# Patient Record
Sex: Female | Born: 1990 | Race: White | Hispanic: No | Marital: Married | Smoking: Current every day smoker
Health system: Southern US, Community
[De-identification: ages and names within clinical notes are randomized; demographics above are authoritative.]

## PROBLEM LIST (undated history)

## (undated) ENCOUNTER — Emergency Department (HOSPITAL_COMMUNITY): Admission: EM | Payer: Self-pay | Source: Home / Self Care

---
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
## 2012-03-18 ENCOUNTER — Encounter: Payer: Self-pay | Admitting: Family Medicine

## 2012-03-18 ENCOUNTER — Ambulatory Visit: Payer: BC Managed Care – PPO

## 2012-03-18 ENCOUNTER — Ambulatory Visit: Payer: BC Managed Care – PPO | Admitting: Family Medicine

## 2012-03-18 VITALS — BP 122/86 | HR 82 | Temp 98.0°F | Resp 16 | Ht 65.0 in | Wt 205.4 lb

## 2012-03-18 DIAGNOSIS — R059 Cough, unspecified: Secondary | ICD-10-CM

## 2012-03-18 DIAGNOSIS — J40 Bronchitis, not specified as acute or chronic: Secondary | ICD-10-CM

## 2012-03-18 DIAGNOSIS — R05 Cough: Secondary | ICD-10-CM

## 2012-03-18 DIAGNOSIS — B079 Viral wart, unspecified: Secondary | ICD-10-CM

## 2012-03-18 LAB — POCT CBC
Granulocyte percent: 70.5 %G (ref 37–80)
HCT, POC: 46.4 % (ref 37.7–47.9)
Hemoglobin: 14.4 g/dL (ref 12.2–16.2)
Lymph, poc: 1.9 (ref 0.6–3.4)
MCH, POC: 29 pg (ref 27–31.2)
MCHC: 31 g/dL — AB (ref 31.8–35.4)
MCV: 93.6 fL (ref 80–97)
MID (cbc): 0.6 (ref 0–0.9)
MPV: 10.1 fL (ref 0–99.8)
POC Granulocyte: 5.8 (ref 2–6.9)
POC LYMPH PERCENT: 22.7 %L (ref 10–50)
POC MID %: 6.8 %M (ref 0–12)
Platelet Count, POC: 304 10*3/uL (ref 142–424)
RBC: 4.96 M/uL (ref 4.04–5.48)
RDW, POC: 12.8 %
WBC: 8.2 10*3/uL (ref 4.6–10.2)

## 2012-03-18 MED ORDER — HYDROCODONE-HOMATROPINE 5-1.5 MG/5ML PO SYRP: 5.0000 mL | ORAL_SOLUTION | Freq: Four times a day (QID) | ORAL | Status: AC | PRN

## 2012-03-18 MED ORDER — DOXYCYCLINE HYCLATE 100 MG PO TABS: 100.0000 mg | ORAL_TABLET | Freq: Two times a day (BID) | ORAL | Status: AC

## 2012-03-18 MED ORDER — BENZONATATE 100 MG PO CAPS: 200.0000 mg | ORAL_CAPSULE | Freq: Two times a day (BID) | ORAL | Status: AC | PRN

## 2012-03-18 NOTE — Progress Notes (Signed)
[facility]:  Office Visit  Chief Complaint:  Chief Complaint  Patient presents with  . Pneumonia    Patient states she was diagnosed with early stages of Pneumonia, she was put on a Zpack and she is not feeling better. wats a recheck    HPI: Lauren Newton is a 21 y.o. female who complains of  Cough, chest congestion, dark sputum production, was on Z-pack for "early stages of PNA" but not feeling any better, Denies asthma, allergies. Has had some wheezing this AM. + smoker, minimal sinus congestion, denies msk pain, ear pain.   History reviewed. No pertinent past medical history. History reviewed. No pertinent past surgical history. History   Social History  . Marital Status: Married    Spouse Name: N/A    Number of Children: N/A  . Years of Education: N/A   Social History Main Topics  . Smoking status: Current Every Day Smoker  . Smokeless tobacco: None  . Alcohol Use:   . Drug Use: 7 per week    Special: Marijuana  . Sexually Active: Yes    Birth Control/ Protection: None   Other Topics Concern  . None   Social History Narrative  . None   No family history on file. No Known Allergies Prior to Admission medications   Not on File     ROS: The patient denies fevers, chills, night sweats, unintentional weight loss, chest pain, palpitations, wheezing, dyspnea on exertion, nausea, vomiting, abdominal pain, dysuria, hematuria, melena, numbness, weakness, or tingling.   All other systems have been reviewed and were otherwise negative with the exception of those mentioned in the HPI and as above.    PHYSICAL EXAM: Filed Vitals:   03/18/12 1246  BP: 122/86  Pulse: 82  Temp: 98 F (36.7 C)  Resp: 16   Filed Vitals:   03/18/12 1246  Height: 5\' 5"  (1.651 m)  Weight: 205 lb 6.4 oz (93.169 kg)   Body mass index is 34.18 kg/(m^2).  General: Alert, no acute distress HEENT:  Normocephalic, atraumatic, oropharynx patent.  Cardiovascular:   Regular rate and rhythm, no rubs murmurs or gallops.  No Carotid bruits, radial pulse intact. No pedal edema.  Respiratory: Clear to auscultation bilaterally.  No wheezes, rales, or rhonchi.  No cyanosis, no use of accessory musculature GI: No organomegaly, abdomen is soft and non-tender, positive bowel sounds.  No masses. Skin: wart on right index finger Neurologic: Facial musculature symmetric. Psychiatric: Patient is appropriate throughout our interaction. Lymphatic: No cervical lymphadenopathy Musculoskeletal: Gait intact.   LABS: Results for orders placed in visit on 03/18/12  POCT CBC      Component Value Range   WBC 8.2  4.6 - 10.2 K/uL   Lymph, poc 1.9  0.6 - 3.4   POC LYMPH PERCENT 22.7  10 - 50 %L   MID (cbc) 0.6  0 - 0.9   POC MID % 6.8  0 - 12 %M   POC Granulocyte 5.8  2 - 6.9   Granulocyte percent 70.5  37 - 80 %G   RBC 4.96  4.04 - 5.48 M/uL   Hemoglobin 14.4  12.2 - 16.2 g/dL   HCT, POC 16.1  09.6 - 47.9 %   MCV 93.6  80 - 97 fL   MCH, POC 29.0  27 - 31.2 pg   MCHC 31.0 (*) 31.8 - 35.4 g/dL   RDW, POC 04.5     Platelet Count, POC 304  142 - 424  K/uL   MPV 10.1  0 - 99.8 fL     EKG/XRAY:   Primary read interpreted by Dr. Conley Rolls at [facility]. + increase vascular markings + bronchitic changes, no pneumothorax, no e/o ifiltrate   ASSESSMENT/PLAN: Encounter Diagnoses  Name Primary?  . Cough Yes  . Bronchitis   . Wart viral    Most likely bronchitis and not PNA Patient has been on Azithromycin for 1 week and not feeling better Continues to smoke but less than before I told her to try sx treatment and if it does not help then she may take the Doxycycline however I  Do not think this is what is needed, patient is insistent on getting rx for abx School note for today Advise to stop smoking.  Wart on right hand, index finger txt with cryo x1. Pt verbally consented to procedure, tolerated it well, no complications   Lauren Dokken PHUONG, DO 03/19/2012 9:38 AM

## 2012-03-19 NOTE — Patient Instructions (Signed)
Antibiotic Resistance  Antibiotics are drugs. They fight infections caused by bacteria. Antibiotics greatly reduce illness and death from infectious diseases. Over time, the bacteria that antibiotics once controlled are much harder to kill.  CAUSES   Antibiotic resistance occurs when bacteria change in some way. These changes can lessen the abilities of drugs designed to cure infections. The over-use of antibiotics can cause antibiotic resistance. Almost all important bacterial infections in the world are becoming resistant to drugs. Antibiotic resistance has been called one of the world's most pressing public health problems.   Antibiotics should be used to treat bacterial infections. But they are not effective against viral infections. These include the common cold, most sore throats, and the flu. Smart use of antibiotics will control the spread of resistance.   TREATMENT    Only use antibiotics as prescribed by your caregiver.   Talk with your caregiver about antibiotic resistance.   Ask what else you can do to feel better.   Do not take an antibiotic for a viral infection. This could be a cold, cough or the flu.   Do not save some of your antibiotic for the next time you get sick.   Take an antibiotic exactly as the caregiver tells you.   Do not take an antibiotic that is prescribed for someone else.   Use the antibiotic as directed. Take the correct dose at the scheduled time.  SEEK MEDICAL CARE IF:   You react to the antibiotic with:   A rash.   Itching.   An upset stomach.  Document Released: 08/04/2002 Document Revised: 08/06/2011 Document Reviewed: 03/08/2008  [employer] Patient Information 2013 [employer].

## 2014-05-13 ENCOUNTER — Encounter (HOSPITAL_COMMUNITY): Payer: Self-pay | Admitting: Cardiology

## 2014-05-13 NOTE — ED Notes (Signed)
Pt reports that she is a type 2 diabetic and her CBG has been in the 400s today. Reports increased thirst, and headache at home. Reports that she takes Metformin

## 2014-11-15 ENCOUNTER — Emergency Department (HOSPITAL_COMMUNITY)
Admission: EM | Admit: 2014-11-15 | Discharge: 2014-11-15 | Disposition: A | Discharge status: Home / Self Care | Payer: BLUE CROSS/BLUE SHIELD | Attending: Emergency Medicine | Admitting: Emergency Medicine

## 2014-11-15 ENCOUNTER — Encounter (HOSPITAL_COMMUNITY): Payer: Self-pay

## 2014-11-15 DIAGNOSIS — Z720 Tobacco use: Secondary | ICD-10-CM | POA: Diagnosis not present

## 2014-11-15 DIAGNOSIS — H109 Unspecified conjunctivitis: Secondary | ICD-10-CM | POA: Diagnosis not present

## 2014-11-15 DIAGNOSIS — H578 Other specified disorders of eye and adnexa: Secondary | ICD-10-CM | POA: Diagnosis present

## 2014-11-15 MED ORDER — ERYTHROMYCIN 5 MG/GM OP OINT
TOPICAL_OINTMENT | Freq: Four times a day (QID) | OPHTHALMIC | Status: DC
  Administered 2014-11-15: via OPHTHALMIC
  Filled 2014-11-15: qty 3.5

## 2014-11-15 NOTE — ED Notes (Signed)
Pt presents with c/o right eye problem that started today. Pt reports pain to that right eye as well as some draining and redness.

## 2014-11-15 NOTE — ED Provider Notes (Signed)
CSN: [id number]     Arrival date & time 11/15/14  2103 History  This chart was scribed for non-physician practitioner Elpidio Anis, PA-C working with Gilda Crease, MD by Lyndel Safe, ED Scribe. This patient was seen in room WTR5/WTR5 and the patient's care was started at 11:11 PM.   Chief Complaint  Patient presents with  . Eye Problem   The history is provided by the patient. No language interpreter was used.   HPI Comments: Lauren Newton is a 24 y.o. female who presents to the Emergency Department complaining of sudden onset, constant, mild right eye sensitivity with associated mild drainage and erythema that began earlier today. She states her peripheral vision in her right eye is blurry. She wears correctional lenses and is followed by [facility] opthalmology. Pt denies foreign body in right eye, left eye vision changes, photophobia, or vision loss. NKDA  History reviewed. No pertinent past medical history. History reviewed. No pertinent past surgical history. No family history on file. History  Substance Use Topics  . Smoking status: Current Every Day Smoker  . Smokeless tobacco: Not on file  . Alcohol Use: No   OB History    No data available     Review of Systems  Constitutional: Negative for fever.  Eyes: Positive for discharge and redness. Negative for photophobia and visual disturbance.   Allergies  Review of patient's allergies indicates no known allergies.  Home Medications   Prior to Admission medications   Medication Sig Start Date End Date Taking? Authorizing Provider  doxycycline (VIBRA-TABS) 100 MG tablet Take 1 tablet (100 mg total) by mouth 2 (two) times daily. Patient not taking: Reported on 11/15/2014 03/18/12   Thao P Le, DO  HYDROcodone-homatropine (HYDROMET) 5-1.5 MG/5ML syrup Take 5 mLs by mouth every 6 (six) hours as needed for cough. Patient not taking: Reported on 11/15/2014 03/18/12   Thao P Le, DO   BP 134/70 mmHg  Pulse 90   Temp(Src) 98.2 F (36.8 C) (Oral)  Resp 18  SpO2 100%  LMP 10/04/2014 (Approximate) Physical Exam  Constitutional: She is oriented to person, place, and time. She appears well-developed and well-nourished. No distress.  HENT:  Head: Normocephalic.  Mouth/Throat: No oropharyngeal exudate.  No facial tenderness.   Eyes: Pupils are equal, round, and reactive to light. Right eye exhibits no discharge. Left eye exhibits no discharge. No scleral icterus.  Right conjunctival redness without purulence or chemosis. Cornea clear. Eyes pearled. No surrounding lid swelling or redness.   Cardiovascular: Normal rate.   Pulmonary/Chest: Effort normal.  Neurological: She is alert and oriented to person, place, and time.  Skin: Skin is warm and dry.  Psychiatric: She has a normal mood and affect.  Nursing note and vitals reviewed.  ED Course  Procedures  DIAGNOSTIC STUDIES: Oxygen Saturation is 100% on RA, normal by my interpretation.    COORDINATION OF CARE: 11:13 PM Discussed treatment plan which includes to prescribe antibiotic ointment. Pt acknowledges and agrees to plan.   Labs Review Labs Reviewed - No data to display  Imaging Review No results found.   EKG Interpretation None      MDM   Final diagnoses:  None    1. Conjunctivitis  Erythromycin ointment provided. She will follow up with [facility] if symptoms persist.  I personally performed the services described in this documentation, which was scribed in my presence. The recorded information has been reviewed and is accurate.     Elpidio Anis, PA-C 11/16/14 [phone]  Gilda Crease, MD 11/22/14 [phone]

## 2014-11-15 NOTE — Discharge Instructions (Signed)

## 2017-02-26 DIAGNOSIS — M9903 Segmental and somatic dysfunction of lumbar region: Secondary | ICD-10-CM | POA: Diagnosis not present

## 2017-02-26 DIAGNOSIS — M9902 Segmental and somatic dysfunction of thoracic region: Secondary | ICD-10-CM | POA: Diagnosis not present

## 2017-02-26 DIAGNOSIS — M9901 Segmental and somatic dysfunction of cervical region: Secondary | ICD-10-CM | POA: Diagnosis not present

## 2017-04-02 DIAGNOSIS — M9902 Segmental and somatic dysfunction of thoracic region: Secondary | ICD-10-CM | POA: Diagnosis not present

## 2017-04-02 DIAGNOSIS — M9901 Segmental and somatic dysfunction of cervical region: Secondary | ICD-10-CM | POA: Diagnosis not present

## 2017-04-02 DIAGNOSIS — M9903 Segmental and somatic dysfunction of lumbar region: Secondary | ICD-10-CM | POA: Diagnosis not present

## 2017-05-13 DIAGNOSIS — M9902 Segmental and somatic dysfunction of thoracic region: Secondary | ICD-10-CM | POA: Diagnosis not present

## 2017-05-13 DIAGNOSIS — M9901 Segmental and somatic dysfunction of cervical region: Secondary | ICD-10-CM | POA: Diagnosis not present

## 2017-05-13 DIAGNOSIS — M9903 Segmental and somatic dysfunction of lumbar region: Secondary | ICD-10-CM | POA: Diagnosis not present

## 2017-06-18 DIAGNOSIS — M9901 Segmental and somatic dysfunction of cervical region: Secondary | ICD-10-CM | POA: Diagnosis not present

## 2017-06-18 DIAGNOSIS — M9903 Segmental and somatic dysfunction of lumbar region: Secondary | ICD-10-CM | POA: Diagnosis not present

## 2017-06-18 DIAGNOSIS — M9902 Segmental and somatic dysfunction of thoracic region: Secondary | ICD-10-CM | POA: Diagnosis not present

## 2017-07-08 DIAGNOSIS — F411 Generalized anxiety disorder: Secondary | ICD-10-CM | POA: Diagnosis not present

## 2017-07-16 DIAGNOSIS — M9901 Segmental and somatic dysfunction of cervical region: Secondary | ICD-10-CM | POA: Diagnosis not present

## 2017-07-16 DIAGNOSIS — M9902 Segmental and somatic dysfunction of thoracic region: Secondary | ICD-10-CM | POA: Diagnosis not present

## 2017-07-16 DIAGNOSIS — M9903 Segmental and somatic dysfunction of lumbar region: Secondary | ICD-10-CM | POA: Diagnosis not present

## 2017-07-25 DIAGNOSIS — M9903 Segmental and somatic dysfunction of lumbar region: Secondary | ICD-10-CM | POA: Diagnosis not present

## 2017-07-25 DIAGNOSIS — M9902 Segmental and somatic dysfunction of thoracic region: Secondary | ICD-10-CM | POA: Diagnosis not present

## 2017-07-25 DIAGNOSIS — M9901 Segmental and somatic dysfunction of cervical region: Secondary | ICD-10-CM | POA: Diagnosis not present

## 2017-07-25 DIAGNOSIS — F411 Generalized anxiety disorder: Secondary | ICD-10-CM | POA: Diagnosis not present

## 2017-08-16 DIAGNOSIS — F411 Generalized anxiety disorder: Secondary | ICD-10-CM | POA: Diagnosis not present

## 2017-08-22 DIAGNOSIS — M9902 Segmental and somatic dysfunction of thoracic region: Secondary | ICD-10-CM | POA: Diagnosis not present

## 2017-08-22 DIAGNOSIS — M9903 Segmental and somatic dysfunction of lumbar region: Secondary | ICD-10-CM | POA: Diagnosis not present

## 2017-08-22 DIAGNOSIS — M9901 Segmental and somatic dysfunction of cervical region: Secondary | ICD-10-CM | POA: Diagnosis not present

## 2017-09-24 DIAGNOSIS — M9903 Segmental and somatic dysfunction of lumbar region: Secondary | ICD-10-CM | POA: Diagnosis not present

## 2017-09-24 DIAGNOSIS — M9902 Segmental and somatic dysfunction of thoracic region: Secondary | ICD-10-CM | POA: Diagnosis not present

## 2017-09-24 DIAGNOSIS — F411 Generalized anxiety disorder: Secondary | ICD-10-CM | POA: Diagnosis not present

## 2017-09-24 DIAGNOSIS — M9901 Segmental and somatic dysfunction of cervical region: Secondary | ICD-10-CM | POA: Diagnosis not present

## 2017-10-22 DIAGNOSIS — M9902 Segmental and somatic dysfunction of thoracic region: Secondary | ICD-10-CM | POA: Diagnosis not present

## 2017-10-22 DIAGNOSIS — M9903 Segmental and somatic dysfunction of lumbar region: Secondary | ICD-10-CM | POA: Diagnosis not present

## 2017-10-22 DIAGNOSIS — M9901 Segmental and somatic dysfunction of cervical region: Secondary | ICD-10-CM | POA: Diagnosis not present

## 2017-11-19 DIAGNOSIS — M9902 Segmental and somatic dysfunction of thoracic region: Secondary | ICD-10-CM | POA: Diagnosis not present

## 2017-11-19 DIAGNOSIS — M9903 Segmental and somatic dysfunction of lumbar region: Secondary | ICD-10-CM | POA: Diagnosis not present

## 2017-11-19 DIAGNOSIS — M9901 Segmental and somatic dysfunction of cervical region: Secondary | ICD-10-CM | POA: Diagnosis not present

## 2017-12-18 DIAGNOSIS — M9901 Segmental and somatic dysfunction of cervical region: Secondary | ICD-10-CM | POA: Diagnosis not present

## 2017-12-18 DIAGNOSIS — M9902 Segmental and somatic dysfunction of thoracic region: Secondary | ICD-10-CM | POA: Diagnosis not present

## 2017-12-18 DIAGNOSIS — M9903 Segmental and somatic dysfunction of lumbar region: Secondary | ICD-10-CM | POA: Diagnosis not present

## 2018-01-22 DIAGNOSIS — M9901 Segmental and somatic dysfunction of cervical region: Secondary | ICD-10-CM | POA: Diagnosis not present

## 2018-01-22 DIAGNOSIS — M9903 Segmental and somatic dysfunction of lumbar region: Secondary | ICD-10-CM | POA: Diagnosis not present

## 2018-01-22 DIAGNOSIS — M9902 Segmental and somatic dysfunction of thoracic region: Secondary | ICD-10-CM | POA: Diagnosis not present

## 2018-02-04 DIAGNOSIS — F411 Generalized anxiety disorder: Secondary | ICD-10-CM | POA: Diagnosis not present

## 2018-02-07 DIAGNOSIS — M9901 Segmental and somatic dysfunction of cervical region: Secondary | ICD-10-CM | POA: Diagnosis not present

## 2018-02-07 DIAGNOSIS — M9903 Segmental and somatic dysfunction of lumbar region: Secondary | ICD-10-CM | POA: Diagnosis not present

## 2018-02-07 DIAGNOSIS — M9902 Segmental and somatic dysfunction of thoracic region: Secondary | ICD-10-CM | POA: Diagnosis not present

## 2018-02-26 DIAGNOSIS — F411 Generalized anxiety disorder: Secondary | ICD-10-CM | POA: Diagnosis not present

## 2018-02-28 DIAGNOSIS — M9903 Segmental and somatic dysfunction of lumbar region: Secondary | ICD-10-CM | POA: Diagnosis not present

## 2018-02-28 DIAGNOSIS — M9901 Segmental and somatic dysfunction of cervical region: Secondary | ICD-10-CM | POA: Diagnosis not present

## 2018-02-28 DIAGNOSIS — M9902 Segmental and somatic dysfunction of thoracic region: Secondary | ICD-10-CM | POA: Diagnosis not present

## 2018-03-18 DIAGNOSIS — F411 Generalized anxiety disorder: Secondary | ICD-10-CM | POA: Diagnosis not present

## 2018-03-20 DIAGNOSIS — M9902 Segmental and somatic dysfunction of thoracic region: Secondary | ICD-10-CM | POA: Diagnosis not present

## 2018-03-20 DIAGNOSIS — M9903 Segmental and somatic dysfunction of lumbar region: Secondary | ICD-10-CM | POA: Diagnosis not present

## 2018-03-20 DIAGNOSIS — M791 Myalgia, unspecified site: Secondary | ICD-10-CM | POA: Diagnosis not present

## 2018-03-20 DIAGNOSIS — M9901 Segmental and somatic dysfunction of cervical region: Secondary | ICD-10-CM | POA: Diagnosis not present

## 2018-04-01 DIAGNOSIS — F411 Generalized anxiety disorder: Secondary | ICD-10-CM | POA: Diagnosis not present

## 2018-04-02 DIAGNOSIS — M9901 Segmental and somatic dysfunction of cervical region: Secondary | ICD-10-CM | POA: Diagnosis not present

## 2018-04-02 DIAGNOSIS — M9903 Segmental and somatic dysfunction of lumbar region: Secondary | ICD-10-CM | POA: Diagnosis not present

## 2018-04-02 DIAGNOSIS — M9902 Segmental and somatic dysfunction of thoracic region: Secondary | ICD-10-CM | POA: Diagnosis not present

## 2018-04-02 DIAGNOSIS — M791 Myalgia, unspecified site: Secondary | ICD-10-CM | POA: Diagnosis not present

## 2018-04-08 DIAGNOSIS — F411 Generalized anxiety disorder: Secondary | ICD-10-CM | POA: Diagnosis not present

## 2018-04-17 DIAGNOSIS — M9902 Segmental and somatic dysfunction of thoracic region: Secondary | ICD-10-CM | POA: Diagnosis not present

## 2018-04-17 DIAGNOSIS — M791 Myalgia, unspecified site: Secondary | ICD-10-CM | POA: Diagnosis not present

## 2018-04-17 DIAGNOSIS — M9903 Segmental and somatic dysfunction of lumbar region: Secondary | ICD-10-CM | POA: Diagnosis not present

## 2018-04-17 DIAGNOSIS — M9901 Segmental and somatic dysfunction of cervical region: Secondary | ICD-10-CM | POA: Diagnosis not present

## 2018-05-06 DIAGNOSIS — F411 Generalized anxiety disorder: Secondary | ICD-10-CM | POA: Diagnosis not present

## 2018-05-14 DIAGNOSIS — R509 Fever, unspecified: Secondary | ICD-10-CM | POA: Diagnosis not present

## 2018-05-27 DIAGNOSIS — J0100 Acute maxillary sinusitis, unspecified: Secondary | ICD-10-CM | POA: Diagnosis not present

## 2018-05-29 DIAGNOSIS — F411 Generalized anxiety disorder: Secondary | ICD-10-CM | POA: Diagnosis not present

## 2018-05-30 DIAGNOSIS — M9901 Segmental and somatic dysfunction of cervical region: Secondary | ICD-10-CM | POA: Diagnosis not present

## 2018-05-30 DIAGNOSIS — M9902 Segmental and somatic dysfunction of thoracic region: Secondary | ICD-10-CM | POA: Diagnosis not present

## 2018-05-30 DIAGNOSIS — M791 Myalgia, unspecified site: Secondary | ICD-10-CM | POA: Diagnosis not present

## 2018-05-30 DIAGNOSIS — M9903 Segmental and somatic dysfunction of lumbar region: Secondary | ICD-10-CM | POA: Diagnosis not present

## 2018-06-03 DIAGNOSIS — B078 Other viral warts: Secondary | ICD-10-CM | POA: Diagnosis not present

## 2018-06-12 DIAGNOSIS — F17200 Nicotine dependence, unspecified, uncomplicated: Secondary | ICD-10-CM | POA: Diagnosis not present

## 2018-06-12 DIAGNOSIS — G43009 Migraine without aura, not intractable, without status migrainosus: Secondary | ICD-10-CM | POA: Diagnosis not present

## 2018-06-12 DIAGNOSIS — F411 Generalized anxiety disorder: Secondary | ICD-10-CM | POA: Diagnosis not present

## 2018-06-20 DIAGNOSIS — F411 Generalized anxiety disorder: Secondary | ICD-10-CM | POA: Diagnosis not present

## 2018-07-04 DIAGNOSIS — F411 Generalized anxiety disorder: Secondary | ICD-10-CM | POA: Diagnosis not present

## 2018-07-10 DIAGNOSIS — F411 Generalized anxiety disorder: Secondary | ICD-10-CM | POA: Diagnosis not present

## 2018-07-11 DIAGNOSIS — M791 Myalgia, unspecified site: Secondary | ICD-10-CM | POA: Diagnosis not present

## 2018-07-11 DIAGNOSIS — M9901 Segmental and somatic dysfunction of cervical region: Secondary | ICD-10-CM | POA: Diagnosis not present

## 2018-07-11 DIAGNOSIS — M9902 Segmental and somatic dysfunction of thoracic region: Secondary | ICD-10-CM | POA: Diagnosis not present

## 2018-07-11 DIAGNOSIS — M9903 Segmental and somatic dysfunction of lumbar region: Secondary | ICD-10-CM | POA: Diagnosis not present

## 2018-07-25 DIAGNOSIS — F411 Generalized anxiety disorder: Secondary | ICD-10-CM | POA: Diagnosis not present

## 2018-08-04 DIAGNOSIS — M791 Myalgia, unspecified site: Secondary | ICD-10-CM | POA: Diagnosis not present

## 2018-08-04 DIAGNOSIS — M9903 Segmental and somatic dysfunction of lumbar region: Secondary | ICD-10-CM | POA: Diagnosis not present

## 2018-08-04 DIAGNOSIS — M9901 Segmental and somatic dysfunction of cervical region: Secondary | ICD-10-CM | POA: Diagnosis not present

## 2018-08-04 DIAGNOSIS — M9902 Segmental and somatic dysfunction of thoracic region: Secondary | ICD-10-CM | POA: Diagnosis not present

## 2018-09-02 DIAGNOSIS — F411 Generalized anxiety disorder: Secondary | ICD-10-CM | POA: Diagnosis not present

## 2018-09-29 DIAGNOSIS — M9902 Segmental and somatic dysfunction of thoracic region: Secondary | ICD-10-CM | POA: Diagnosis not present

## 2018-09-29 DIAGNOSIS — M9903 Segmental and somatic dysfunction of lumbar region: Secondary | ICD-10-CM | POA: Diagnosis not present

## 2018-09-29 DIAGNOSIS — M791 Myalgia, unspecified site: Secondary | ICD-10-CM | POA: Diagnosis not present

## 2018-09-29 DIAGNOSIS — M9901 Segmental and somatic dysfunction of cervical region: Secondary | ICD-10-CM | POA: Diagnosis not present

## 2018-10-29 DIAGNOSIS — F411 Generalized anxiety disorder: Secondary | ICD-10-CM | POA: Diagnosis not present

## 2018-10-31 DIAGNOSIS — M9903 Segmental and somatic dysfunction of lumbar region: Secondary | ICD-10-CM | POA: Diagnosis not present

## 2018-10-31 DIAGNOSIS — M9902 Segmental and somatic dysfunction of thoracic region: Secondary | ICD-10-CM | POA: Diagnosis not present

## 2018-10-31 DIAGNOSIS — M9901 Segmental and somatic dysfunction of cervical region: Secondary | ICD-10-CM | POA: Diagnosis not present

## 2018-10-31 DIAGNOSIS — M791 Myalgia, unspecified site: Secondary | ICD-10-CM | POA: Diagnosis not present

## 2018-11-18 DIAGNOSIS — F411 Generalized anxiety disorder: Secondary | ICD-10-CM | POA: Diagnosis not present

## 2018-11-18 DIAGNOSIS — R05 Cough: Secondary | ICD-10-CM | POA: Diagnosis not present

## 2018-11-19 DIAGNOSIS — R05 Cough: Secondary | ICD-10-CM | POA: Diagnosis not present

## 2018-11-26 DIAGNOSIS — F411 Generalized anxiety disorder: Secondary | ICD-10-CM | POA: Diagnosis not present

## 2018-12-02 DIAGNOSIS — M791 Myalgia, unspecified site: Secondary | ICD-10-CM | POA: Diagnosis not present

## 2018-12-02 DIAGNOSIS — M9901 Segmental and somatic dysfunction of cervical region: Secondary | ICD-10-CM | POA: Diagnosis not present

## 2018-12-02 DIAGNOSIS — M9903 Segmental and somatic dysfunction of lumbar region: Secondary | ICD-10-CM | POA: Diagnosis not present

## 2018-12-02 DIAGNOSIS — M9902 Segmental and somatic dysfunction of thoracic region: Secondary | ICD-10-CM | POA: Diagnosis not present

## 2018-12-23 DIAGNOSIS — M9903 Segmental and somatic dysfunction of lumbar region: Secondary | ICD-10-CM | POA: Diagnosis not present

## 2018-12-23 DIAGNOSIS — M791 Myalgia, unspecified site: Secondary | ICD-10-CM | POA: Diagnosis not present

## 2018-12-23 DIAGNOSIS — M9902 Segmental and somatic dysfunction of thoracic region: Secondary | ICD-10-CM | POA: Diagnosis not present

## 2018-12-23 DIAGNOSIS — M9901 Segmental and somatic dysfunction of cervical region: Secondary | ICD-10-CM | POA: Diagnosis not present

## 2018-12-24 DIAGNOSIS — F411 Generalized anxiety disorder: Secondary | ICD-10-CM | POA: Diagnosis not present

## 2019-01-07 DIAGNOSIS — F411 Generalized anxiety disorder: Secondary | ICD-10-CM | POA: Diagnosis not present

## 2019-01-12 DIAGNOSIS — M791 Myalgia, unspecified site: Secondary | ICD-10-CM | POA: Diagnosis not present

## 2019-01-12 DIAGNOSIS — M9902 Segmental and somatic dysfunction of thoracic region: Secondary | ICD-10-CM | POA: Diagnosis not present

## 2019-01-12 DIAGNOSIS — M9901 Segmental and somatic dysfunction of cervical region: Secondary | ICD-10-CM | POA: Diagnosis not present

## 2019-01-12 DIAGNOSIS — M9903 Segmental and somatic dysfunction of lumbar region: Secondary | ICD-10-CM | POA: Diagnosis not present

## 2019-01-26 DIAGNOSIS — F411 Generalized anxiety disorder: Secondary | ICD-10-CM | POA: Diagnosis not present

## 2019-06-04 ENCOUNTER — Other Ambulatory Visit: Payer: Self-pay | Admitting: Family Medicine

## 2019-06-04 DIAGNOSIS — G8929 Other chronic pain: Secondary | ICD-10-CM

## 2019-06-05 ENCOUNTER — Ambulatory Visit (INDEPENDENT_AMBULATORY_CARE_PROVIDER_SITE_OTHER): Payer: 59

## 2019-06-05 ENCOUNTER — Other Ambulatory Visit: Payer: Self-pay | Admitting: Family Medicine

## 2019-06-05 ENCOUNTER — Other Ambulatory Visit: Payer: Self-pay

## 2019-06-05 DIAGNOSIS — R519 Headache, unspecified: Secondary | ICD-10-CM

## 2019-06-08 ENCOUNTER — Other Ambulatory Visit: Payer: BLUE CROSS/BLUE SHIELD

## 2021-10-02 IMAGING — CT CT HEAD W/O CM
3 of 4 series · 15 of 47 positions shown, 18 images · non-contrast
Comparison: No pertinent prior studies available for comparison.

CLINICAL DATA: Severe headache. History of migraine, for evaluation
of episodic intense headache for 2 weeks. Additional history
provided by technologist: Sudden right-sided head pain on [REDACTED]
that was not typical for migraine, nausea x2 weeks.

EXAM:
CT HEAD WITHOUT CONTRAST
TECHNIQUE: Contiguous axial images were obtained from the base of the skull
through the vertex without intravenous contrast.

[Series 2: head wo · axial · 0.44mm/px · z∈[-200,-70]mm · 9 of 32 slices shown, 12 images]
[im 3/32  brain]
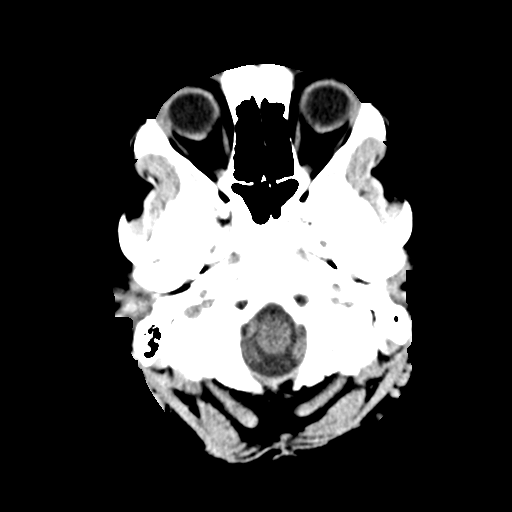
[im 3/32  bone]
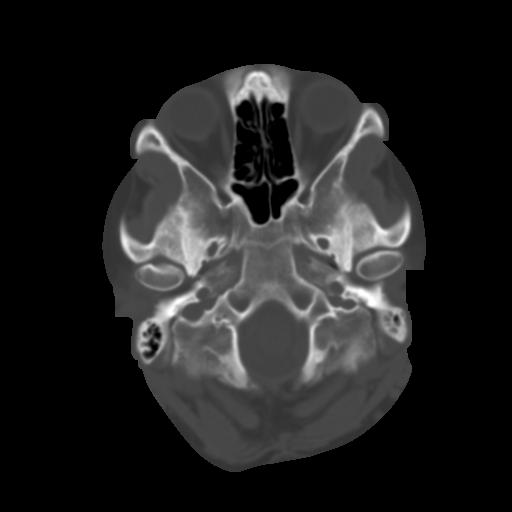
[im 7/32  brain]
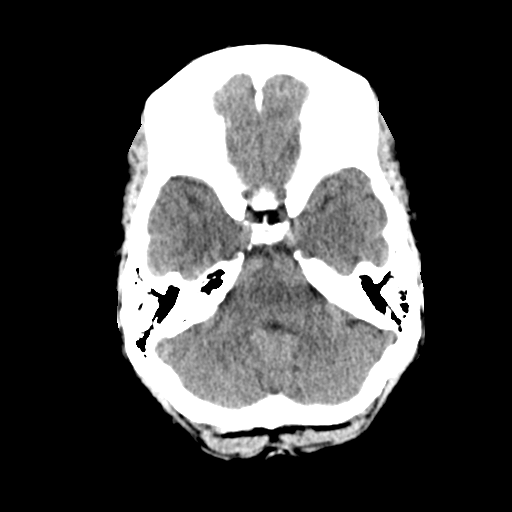
[im 9/32  brain]
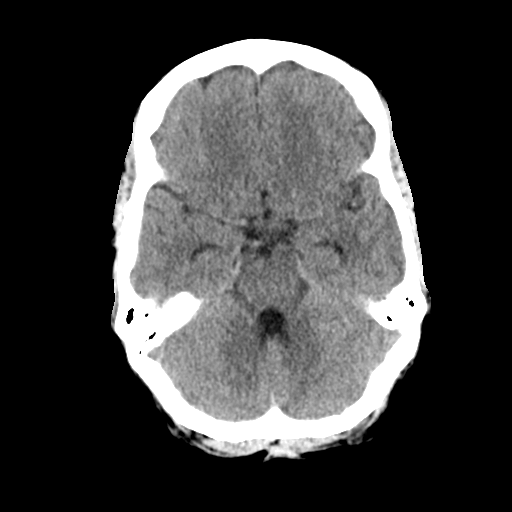
[im 14/32  brain]
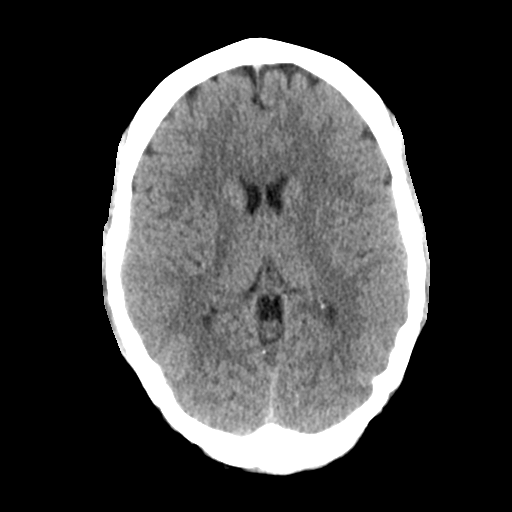
[im 16/32  brain]
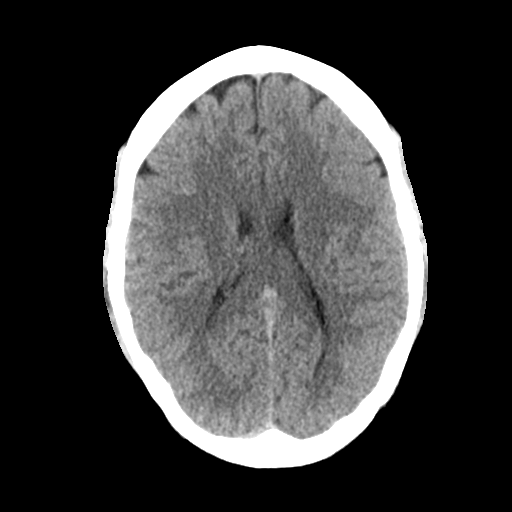
[im 16/32  bone]
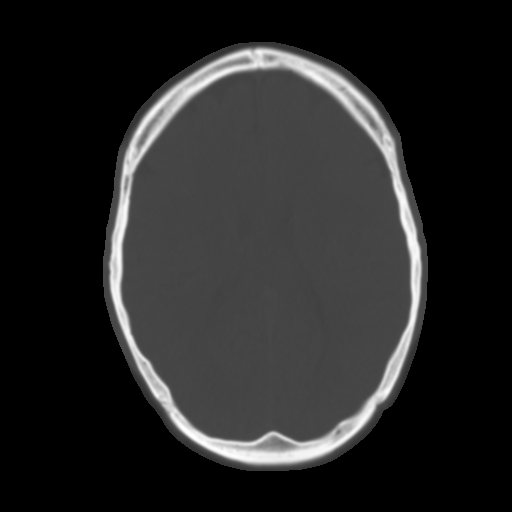
[im 18/32  brain]
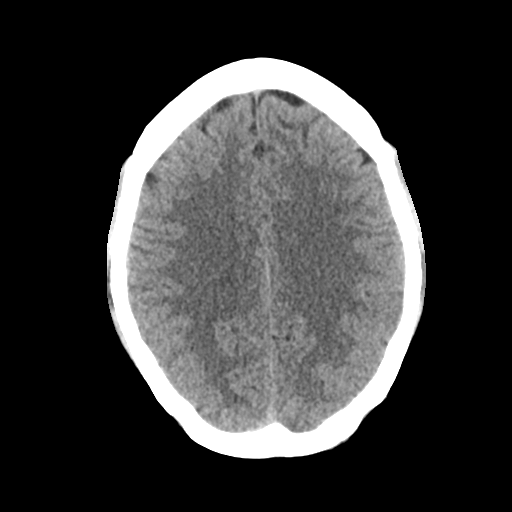
[im 23/32  brain]
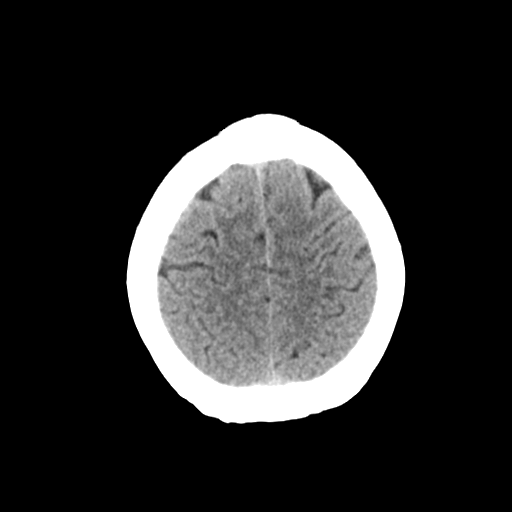
[im 25/32  brain]
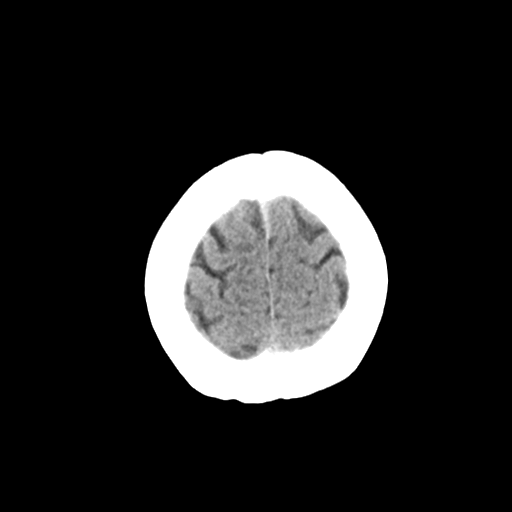
[im 29/32  brain]
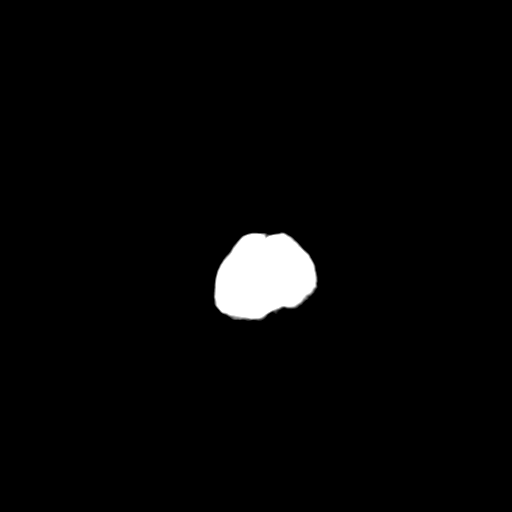
[im 29/32  bone]
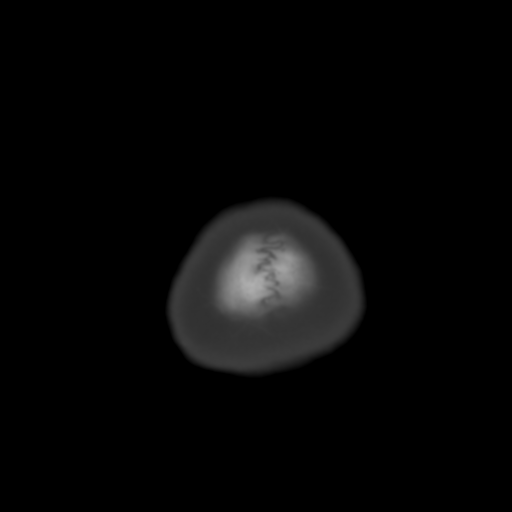

[Series 4: head wo coronal · coronal · 0.32mm/px · 3 of 71 slices shown]
[im 24/71  brain]
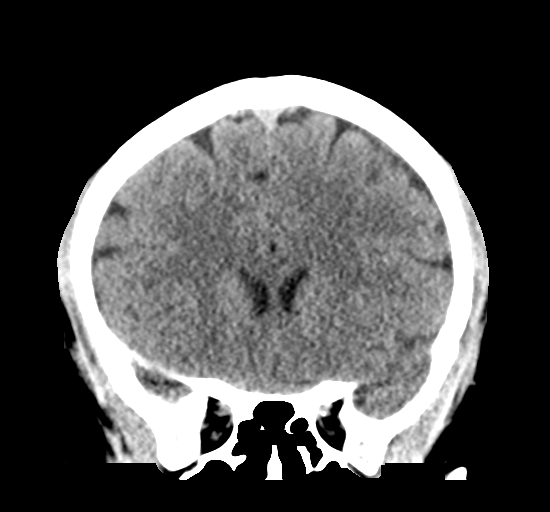
[im 32/71  brain]
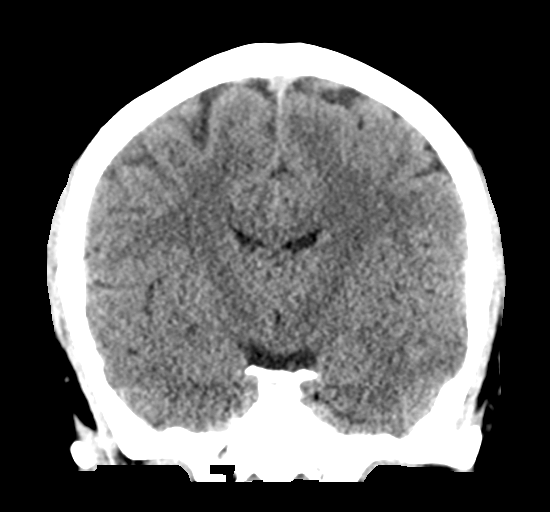
[im 39/71  brain]
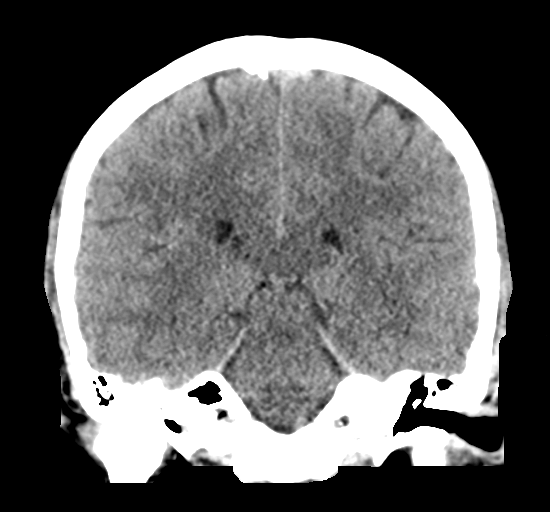

[Series 5: head wo sagittal · sagittal · 0.32mm/px · 3 of 56 slices shown]
[im 19/56  brain]
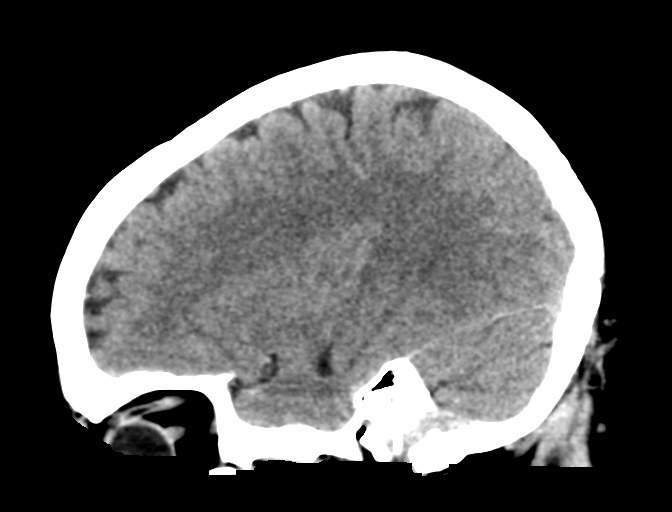
[im 28/56  brain]
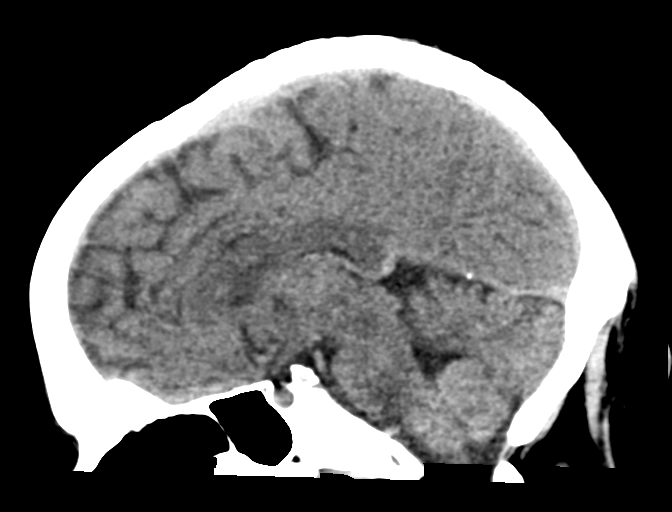
[im 37/56  brain]
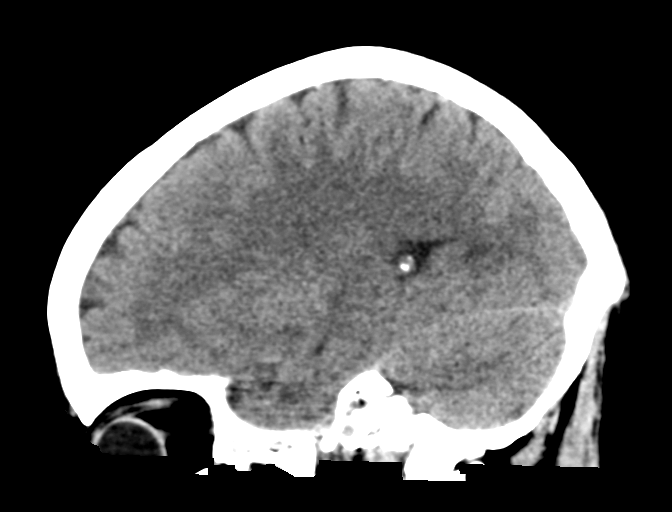

[15 of 47 positions shown; findings below may reference images not displayed]

FINDINGS: Brain:

No evidence of acute intracranial hemorrhage.

No demarcated cortical infarction.

No evidence of intracranial mass.

No midline shift or extra-axial fluid collection.

Cerebral volume is normal for age.

Vascular: No hyperdense vessel.

Skull: No calvarial fracture. Subcentimeter nonspecific focus of
calcification or ossification along the right parietal calvarium
(series 5, image 17).

Sinuses/Orbits: Visualized orbits demonstrate no acute abnormality.
No significant paranasal sinus disease or mastoid effusion at the
imaged levels.
IMPRESSION: Normal CT appearance of the brain. No evidence of acute intracranial
abnormality.

## 2023-07-16 DIAGNOSIS — M7701 Medial epicondylitis, right elbow: Secondary | ICD-10-CM | POA: Diagnosis not present

## 2023-09-03 DIAGNOSIS — F411 Generalized anxiety disorder: Secondary | ICD-10-CM | POA: Diagnosis not present

## 2023-09-03 DIAGNOSIS — F439 Reaction to severe stress, unspecified: Secondary | ICD-10-CM | POA: Diagnosis not present

## 2023-09-03 DIAGNOSIS — Z8659 Personal history of other mental and behavioral disorders: Secondary | ICD-10-CM | POA: Diagnosis not present

## 2023-11-12 DIAGNOSIS — F439 Reaction to severe stress, unspecified: Secondary | ICD-10-CM | POA: Diagnosis not present

## 2023-11-12 DIAGNOSIS — Z8659 Personal history of other mental and behavioral disorders: Secondary | ICD-10-CM | POA: Diagnosis not present

## 2023-11-12 DIAGNOSIS — F411 Generalized anxiety disorder: Secondary | ICD-10-CM | POA: Diagnosis not present

## 2023-11-26 DIAGNOSIS — F439 Reaction to severe stress, unspecified: Secondary | ICD-10-CM | POA: Diagnosis not present

## 2023-11-26 DIAGNOSIS — Z8659 Personal history of other mental and behavioral disorders: Secondary | ICD-10-CM | POA: Diagnosis not present

## 2023-11-26 DIAGNOSIS — F411 Generalized anxiety disorder: Secondary | ICD-10-CM | POA: Diagnosis not present

## 2023-12-10 DIAGNOSIS — F439 Reaction to severe stress, unspecified: Secondary | ICD-10-CM | POA: Diagnosis not present

## 2023-12-10 DIAGNOSIS — F411 Generalized anxiety disorder: Secondary | ICD-10-CM | POA: Diagnosis not present

## 2023-12-10 DIAGNOSIS — Z8659 Personal history of other mental and behavioral disorders: Secondary | ICD-10-CM | POA: Diagnosis not present

## 2023-12-24 DIAGNOSIS — F411 Generalized anxiety disorder: Secondary | ICD-10-CM | POA: Diagnosis not present

## 2023-12-24 DIAGNOSIS — F439 Reaction to severe stress, unspecified: Secondary | ICD-10-CM | POA: Diagnosis not present

## 2023-12-24 DIAGNOSIS — Z8659 Personal history of other mental and behavioral disorders: Secondary | ICD-10-CM | POA: Diagnosis not present

## 2024-01-07 DIAGNOSIS — F439 Reaction to severe stress, unspecified: Secondary | ICD-10-CM | POA: Diagnosis not present

## 2024-01-07 DIAGNOSIS — F411 Generalized anxiety disorder: Secondary | ICD-10-CM | POA: Diagnosis not present

## 2024-01-07 DIAGNOSIS — Z8659 Personal history of other mental and behavioral disorders: Secondary | ICD-10-CM | POA: Diagnosis not present

## 2024-01-16 DIAGNOSIS — F439 Reaction to severe stress, unspecified: Secondary | ICD-10-CM | POA: Diagnosis not present

## 2024-01-16 DIAGNOSIS — F411 Generalized anxiety disorder: Secondary | ICD-10-CM | POA: Diagnosis not present

## 2024-01-16 DIAGNOSIS — Z8659 Personal history of other mental and behavioral disorders: Secondary | ICD-10-CM | POA: Diagnosis not present

## 2024-01-22 DIAGNOSIS — F411 Generalized anxiety disorder: Secondary | ICD-10-CM | POA: Diagnosis not present

## 2024-01-22 DIAGNOSIS — F439 Reaction to severe stress, unspecified: Secondary | ICD-10-CM | POA: Diagnosis not present

## 2024-01-22 DIAGNOSIS — Z8659 Personal history of other mental and behavioral disorders: Secondary | ICD-10-CM | POA: Diagnosis not present

## 2024-01-28 DIAGNOSIS — Z8659 Personal history of other mental and behavioral disorders: Secondary | ICD-10-CM | POA: Diagnosis not present

## 2024-01-28 DIAGNOSIS — F439 Reaction to severe stress, unspecified: Secondary | ICD-10-CM | POA: Diagnosis not present

## 2024-01-28 DIAGNOSIS — F411 Generalized anxiety disorder: Secondary | ICD-10-CM | POA: Diagnosis not present

## 2024-02-04 DIAGNOSIS — F411 Generalized anxiety disorder: Secondary | ICD-10-CM | POA: Diagnosis not present

## 2024-02-04 DIAGNOSIS — F439 Reaction to severe stress, unspecified: Secondary | ICD-10-CM | POA: Diagnosis not present

## 2024-02-04 DIAGNOSIS — Z8659 Personal history of other mental and behavioral disorders: Secondary | ICD-10-CM | POA: Diagnosis not present

## 2024-02-11 DIAGNOSIS — F439 Reaction to severe stress, unspecified: Secondary | ICD-10-CM | POA: Diagnosis not present

## 2024-02-11 DIAGNOSIS — Z8659 Personal history of other mental and behavioral disorders: Secondary | ICD-10-CM | POA: Diagnosis not present

## 2024-02-11 DIAGNOSIS — F411 Generalized anxiety disorder: Secondary | ICD-10-CM | POA: Diagnosis not present

## 2024-02-19 DIAGNOSIS — G43009 Migraine without aura, not intractable, without status migrainosus: Secondary | ICD-10-CM | POA: Diagnosis not present

## 2024-02-19 DIAGNOSIS — F411 Generalized anxiety disorder: Secondary | ICD-10-CM | POA: Diagnosis not present

## 2024-02-19 DIAGNOSIS — K219 Gastro-esophageal reflux disease without esophagitis: Secondary | ICD-10-CM | POA: Diagnosis not present

## 2024-02-19 DIAGNOSIS — Z833 Family history of diabetes mellitus: Secondary | ICD-10-CM | POA: Diagnosis not present

## 2024-04-10 DIAGNOSIS — Z8659 Personal history of other mental and behavioral disorders: Secondary | ICD-10-CM | POA: Diagnosis not present

## 2024-04-10 DIAGNOSIS — F411 Generalized anxiety disorder: Secondary | ICD-10-CM | POA: Diagnosis not present

## 2024-04-10 DIAGNOSIS — F439 Reaction to severe stress, unspecified: Secondary | ICD-10-CM | POA: Diagnosis not present
